# Patient Record
Sex: Female | Born: 1955
Health system: Southern US, Community
[De-identification: ages and names within clinical notes are randomized; demographics above are authoritative.]

## PROBLEM LIST (undated history)

## (undated) DIAGNOSIS — I10 Essential (primary) hypertension: Secondary | ICD-10-CM

## (undated) DIAGNOSIS — E119 Type 2 diabetes mellitus without complications: Secondary | ICD-10-CM

## (undated) HISTORY — PX: CARDIAC SURGERY: SHX584

---

## 2010-12-14 ENCOUNTER — Emergency Department (HOSPITAL_BASED_OUTPATIENT_CLINIC_OR_DEPARTMENT_OTHER)
Admission: EM | Admit: 2010-12-14 | Discharge: 2010-12-14 | Disposition: A | Discharge status: Home / Self Care | Payer: PRIVATE HEALTH INSURANCE | Attending: Emergency Medicine | Admitting: Emergency Medicine

## 2010-12-14 ENCOUNTER — Encounter: Payer: Self-pay | Admitting: *Deleted

## 2010-12-14 ENCOUNTER — Emergency Department (INDEPENDENT_AMBULATORY_CARE_PROVIDER_SITE_OTHER): Payer: PRIVATE HEALTH INSURANCE

## 2010-12-14 DIAGNOSIS — R059 Cough, unspecified: Secondary | ICD-10-CM | POA: Insufficient documentation

## 2010-12-14 DIAGNOSIS — B9789 Other viral agents as the cause of diseases classified elsewhere: Secondary | ICD-10-CM | POA: Insufficient documentation

## 2010-12-14 DIAGNOSIS — J449 Chronic obstructive pulmonary disease, unspecified: Secondary | ICD-10-CM

## 2010-12-14 DIAGNOSIS — F17200 Nicotine dependence, unspecified, uncomplicated: Secondary | ICD-10-CM

## 2010-12-14 DIAGNOSIS — R05 Cough: Secondary | ICD-10-CM

## 2010-12-14 DIAGNOSIS — IMO0001 Reserved for inherently not codable concepts without codable children: Secondary | ICD-10-CM | POA: Insufficient documentation

## 2010-12-14 DIAGNOSIS — J3489 Other specified disorders of nose and nasal sinuses: Secondary | ICD-10-CM | POA: Insufficient documentation

## 2010-12-14 DIAGNOSIS — B349 Viral infection, unspecified: Secondary | ICD-10-CM

## 2010-12-14 HISTORY — DX: Essential (primary) hypertension: I10

## 2010-12-14 MED ORDER — HYDROCODONE-ACETAMINOPHEN 5-500 MG PO TABS: 1.0000 | ORAL_TABLET | Freq: Four times a day (QID) | ORAL | Status: AC | PRN

## 2010-12-14 NOTE — ED Provider Notes (Signed)
History     CSN: [id number] Arrival date & time: 12/14/2010 10:12 AM  Chief Complaint  Patient presents with  . Nasal Congestion  . Cough  . Generalized Body Aches    HPI  (Consider location/radiation/quality/duration/timing/severity/associated sxs/prior treatment)  Patient is a 55 y.o. female presenting with cough. The history is provided by the patient.  Cough The current episode started more than 1 week ago. The problem has been gradually worsening. The cough is productive of sputum. There has been no fever. Associated symptoms include rhinorrhea. Pertinent negatives include no chest pain, no sweats, no weight loss, no headaches and no shortness of breath. She has tried decongestants for the symptoms. The treatment provided no relief. She is a smoker. Her past medical history does not include pneumonia, bronchiectasis or emphysema.  cough and rhinorrhea for the last week. Now more pain in her shoulders. She has fatigue and feels bad. No fevers. Her ears feel full.   Past Medical History  Diagnosis Date  . Hypertension     History reviewed. No pertinent past surgical history.  History reviewed. No pertinent family history.  History  Substance Use Topics  . Smoking status: Never Smoker   . Smokeless tobacco: Never Used  . Alcohol Use: No    OB History    Grav Para Term Preterm Abortions TAB SAB Ect Mult Living                  Review of Systems  Review of Systems  Constitutional: Negative for weight loss, activity change and appetite change.  HENT: Positive for congestion and rhinorrhea. Negative for neck stiffness.   Eyes: Negative for pain.  Respiratory: Positive for cough and chest tightness. Negative for shortness of breath.   Cardiovascular: Negative for chest pain and leg swelling.  Gastrointestinal: Negative for nausea, vomiting, abdominal pain and diarrhea.  Genitourinary: Negative for flank pain.  Musculoskeletal: Negative for back pain.  Skin: Negative  for rash.  Neurological: Negative for weakness, numbness and headaches.  Psychiatric/Behavioral: Negative for behavioral problems.    Allergies  Review of patient's allergies indicates no known allergies.  Home Medications   Current Outpatient Rx  Name Route Sig Dispense Refill  . ATENOLOL 25 MG PO TABS Oral Take 25 mg by mouth daily.        Physical Exam    BP 137/85  Pulse 100  Temp(Src) 98.1 F (36.7 C) (Oral)  Resp 22  SpO2 100%  Physical Exam  Nursing note and vitals reviewed. Constitutional: She is oriented to person, place, and time. She appears well-developed and well-nourished.  HENT:  Head: Normocephalic and atraumatic.       Mild posterior pahryngeal erethema. Without exudate.   Eyes: EOM are normal. Pupils are equal, round, and reactive to light.  Neck: Normal range of motion. Neck supple.  Cardiovascular: Normal rate, regular rhythm and normal heart sounds.   No murmur heard. Pulmonary/Chest: Effort normal. No respiratory distress. She has no wheezes. She has no rales.       Mildly harsh breath sounds.   Abdominal: Soft. Bowel sounds are normal. She exhibits no distension. There is no tenderness. There is no rebound and no guarding.  Musculoskeletal: Normal range of motion.       Tender over trapezius.  Neurological: She is alert and oriented to person, place, and time. No cranial nerve deficit.  Skin: Skin is warm and dry.  Psychiatric: She has a normal mood and affect. Her speech is normal.  ED Course  Procedures (including critical care time)  Labs Reviewed - No data to display No results found.   No diagnosis found.   MDM Cough and URI symptoms. Myalgias. Xray no pneumonia. Will d/c.         Juliet Rude. Rubin Payor, MD 12/16/10 1443

## 2010-12-14 NOTE — ED Notes (Signed)
Pt states she has been congested" for a while" but started getting feeling worse on Friday  States has "stopped up ears" and cough reports sputum is usually clear but a couple of times has blown her nose and coughed up yellowish sputum. States she has been having some body aches and discomfort in neck and upper back just generally feeling "tired and washed out"

## 2014-01-09 DIAGNOSIS — I10 Essential (primary) hypertension: Secondary | ICD-10-CM | POA: Insufficient documentation

## 2014-05-16 DIAGNOSIS — E1165 Type 2 diabetes mellitus with hyperglycemia: Secondary | ICD-10-CM | POA: Insufficient documentation

## 2014-05-16 DIAGNOSIS — E559 Vitamin D deficiency, unspecified: Secondary | ICD-10-CM | POA: Insufficient documentation

## 2014-05-16 DIAGNOSIS — E7800 Pure hypercholesterolemia, unspecified: Secondary | ICD-10-CM | POA: Insufficient documentation

## 2018-10-31 ENCOUNTER — Other Ambulatory Visit: Payer: Self-pay

## 2018-10-31 ENCOUNTER — Emergency Department (HOSPITAL_BASED_OUTPATIENT_CLINIC_OR_DEPARTMENT_OTHER): Payer: Commercial Managed Care - PPO

## 2018-10-31 ENCOUNTER — Emergency Department (HOSPITAL_BASED_OUTPATIENT_CLINIC_OR_DEPARTMENT_OTHER)
Admission: EM | Admit: 2018-10-31 | Discharge: 2018-10-31 | Disposition: A | Discharge status: Home / Self Care | Payer: Commercial Managed Care - PPO | Attending: Emergency Medicine | Admitting: Emergency Medicine

## 2018-10-31 ENCOUNTER — Encounter (HOSPITAL_BASED_OUTPATIENT_CLINIC_OR_DEPARTMENT_OTHER): Payer: Self-pay | Admitting: *Deleted

## 2018-10-31 DIAGNOSIS — M5442 Lumbago with sciatica, left side: Secondary | ICD-10-CM | POA: Insufficient documentation

## 2018-10-31 DIAGNOSIS — I10 Essential (primary) hypertension: Secondary | ICD-10-CM | POA: Diagnosis not present

## 2018-10-31 DIAGNOSIS — F17200 Nicotine dependence, unspecified, uncomplicated: Secondary | ICD-10-CM | POA: Diagnosis not present

## 2018-10-31 DIAGNOSIS — Z79899 Other long term (current) drug therapy: Secondary | ICD-10-CM | POA: Insufficient documentation

## 2018-10-31 DIAGNOSIS — M545 Low back pain: Secondary | ICD-10-CM | POA: Diagnosis present

## 2018-10-31 MED ORDER — HYDROCODONE-ACETAMINOPHEN 5-325 MG PO TABS: 1.0000 | ORAL_TABLET | Freq: Four times a day (QID) | ORAL | 0 refills | Status: AC | PRN

## 2018-10-31 MED ORDER — NAPROXEN 375 MG PO TABS: 375.0000 mg | ORAL_TABLET | Freq: Two times a day (BID) | ORAL | 0 refills | Status: AC

## 2018-10-31 MED ORDER — HYDROCODONE-ACETAMINOPHEN 5-325 MG PO TABS
2.0000 | ORAL_TABLET | Freq: Once | ORAL | Status: AC
  Administered 2018-10-31: 2 via ORAL
  Filled 2018-10-31: qty 2

## 2018-10-31 MED FILL — NAPROXEN 375 MG TABLET: 375 | 7 days supply | Qty: 15 | Fill #0

## 2018-10-31 MED FILL — HYDROCODON-APAP 5-325: 5-325 | 3 days supply | Qty: 15 | Fill #0

## 2018-10-31 NOTE — ED Notes (Signed)
Pt returned from xray, pt tearful c/o pain. Hot pack applied

## 2018-10-31 NOTE — ED Notes (Signed)
ED Provider at bedside. 

## 2018-10-31 NOTE — ED Notes (Signed)
Patient transported to X-ray 

## 2018-10-31 NOTE — ED Notes (Signed)
Pt verbalizes pain when sitting, or lying. Denis bladder or bowel issues.

## 2018-10-31 NOTE — ED Provider Notes (Signed)
[facility] EMERGENCY DEPARTMENT Provider Note   CSN: [id number] Arrival date & time: 10/31/18  1128     History   Chief Complaint Chief Complaint  Patient presents with  . Back Pain    HPI Beverly Sullivan is a 63 y.o. female.     Patient is a 63 year old female with history of hypertension.  She presents today with complaints of low back pain.  This started several days ago when she bent over to pick up an object.  She describes discomfort in her low back that radiates to her left buttock.  She denies any weakness or numbness in her legs.  She denies any bowel or bladder complaints.  Pain is worse with bending, movement, and ambulation.  There are no alleviating factors.  The history is provided by the patient.  Back Pain Location:  Lumbar spine Quality:  Stabbing Radiates to: Left buttock. Pain severity:  Severe Pain is:  Same all the time Onset quality:  Sudden Duration:  3 days Timing:  Constant Progression:  Worsening Relieved by:  Nothing Worsened by:  Palpation, ambulation and bending   Past Medical History:  Diagnosis Date  . Hypertension     There are no active problems to display for this patient.   History reviewed. No pertinent surgical history.   OB History   No obstetric history on file.      Home Medications    Prior to Admission medications   Medication Sig Start Date End Date Taking? Authorizing Provider  atenolol (TENORMIN) 25 MG tablet Take 25 mg by mouth daily.     Yes [provider]    Family History No family history on file.  Social History Social History   Tobacco Use  . Smoking status: Current Every Day Smoker  . Smokeless tobacco: Never Used  Substance Use Topics  . Alcohol use: Yes  . Drug use: No     Allergies   Patient has no known allergies.   Review of Systems Review of Systems  Musculoskeletal: Positive for back pain.  All other systems reviewed and are negative.    Physical Exam  Updated Vital Signs BP (!) 184/94   Pulse (!) 104   Temp 98.4 F (36.9 C) (Oral)   Resp 18   Ht 5\' 1"  (1.549 m)   Wt 67.1 kg   SpO2 98%   BMI 27.96 kg/m   Physical Exam Vitals signs and nursing note reviewed.  Constitutional:      General: She is not in acute distress.    Appearance: Normal appearance. She is not ill-appearing.  HENT:     Head: Normocephalic and atraumatic.  Pulmonary:     Effort: Pulmonary effort is normal.  Musculoskeletal:     Comments: There is tenderness to palpation in the soft tissues of the lower lumbar region and upper left buttock.  Skin:    General: Skin is warm and dry.  Neurological:     Mental Status: She is alert and oriented to person, place, and time.     Comments: DTRs are trace and symmetrical in both lower extremities.  Strength is 5 out of 5 in both lower extremities.  Patient is able to ambulate and bear weight, however with an antalgic gait.      ED Treatments / Results  Labs (all labs ordered are listed, but only abnormal results are displayed) Labs Reviewed - No data to display  EKG None  Radiology No results found.  Procedures Procedures (  including critical care time)  Medications Ordered in ED Medications  HYDROcodone-acetaminophen (NORCO/VICODIN) 5-325 MG per tablet 2 tablet (has no administration in time range)     Initial Impression / Assessment and Plan / ED Course  I have reviewed the triage vital signs and the nursing notes.  Pertinent labs & imaging results that were available during my care of the patient were reviewed by me and considered in my medical decision making (see chart for details).  Patient presents with complaints of back pain that began in the absence of any injury or trauma.  Patient's strength and reflexes are symmetrical and there are no bowel or bladder complaints.  There are no red flags that would suggest an emergent situation.  She is feeling better after medication given in the ER.   Patient will be discharged with anti-inflammatories, pain medicine, and follow-up as needed.  Final Clinical Impressions(s) / ED Diagnoses   Final diagnoses:  None    ED Discharge Orders    None       Veryl Speak, MD 10/31/18 1326

## 2018-10-31 NOTE — ED Triage Notes (Signed)
Lower back pain. Pain started after bending over 2 days ago.

## 2018-10-31 NOTE — Discharge Instructions (Signed)
Naproxen as prescribed.  Hydrocodone as prescribed as needed for pain not relieved with naproxen.  Follow-up with your primary doctor if your symptoms or not improving in the next few days.

## 2019-12-03 ENCOUNTER — Other Ambulatory Visit: Payer: Self-pay

## 2019-12-03 ENCOUNTER — Emergency Department (HOSPITAL_BASED_OUTPATIENT_CLINIC_OR_DEPARTMENT_OTHER): Payer: Commercial Managed Care - PPO

## 2019-12-03 ENCOUNTER — Encounter (HOSPITAL_BASED_OUTPATIENT_CLINIC_OR_DEPARTMENT_OTHER): Payer: Self-pay

## 2019-12-03 ENCOUNTER — Emergency Department (HOSPITAL_BASED_OUTPATIENT_CLINIC_OR_DEPARTMENT_OTHER)
Admission: EM | Admit: 2019-12-03 | Discharge: 2019-12-03 | Disposition: A | Discharge status: Home / Self Care | Payer: Commercial Managed Care - PPO | Attending: Emergency Medicine | Admitting: Emergency Medicine

## 2019-12-03 ENCOUNTER — Encounter: Payer: Self-pay | Admitting: Emergency Medicine

## 2019-12-03 DIAGNOSIS — R059 Cough, unspecified: Secondary | ICD-10-CM

## 2019-12-03 DIAGNOSIS — I10 Essential (primary) hypertension: Secondary | ICD-10-CM | POA: Diagnosis not present

## 2019-12-03 DIAGNOSIS — J069 Acute upper respiratory infection, unspecified: Secondary | ICD-10-CM | POA: Insufficient documentation

## 2019-12-03 DIAGNOSIS — Z7984 Long term (current) use of oral hypoglycemic drugs: Secondary | ICD-10-CM | POA: Insufficient documentation

## 2019-12-03 DIAGNOSIS — Z20822 Contact with and (suspected) exposure to covid-19: Secondary | ICD-10-CM | POA: Insufficient documentation

## 2019-12-03 DIAGNOSIS — E119 Type 2 diabetes mellitus without complications: Secondary | ICD-10-CM | POA: Diagnosis not present

## 2019-12-03 DIAGNOSIS — R000 Tachycardia, unspecified: Secondary | ICD-10-CM | POA: Insufficient documentation

## 2019-12-03 DIAGNOSIS — R05 Cough: Secondary | ICD-10-CM | POA: Diagnosis present

## 2019-12-03 DIAGNOSIS — F17210 Nicotine dependence, cigarettes, uncomplicated: Secondary | ICD-10-CM | POA: Insufficient documentation

## 2019-12-03 HISTORY — DX: Type 2 diabetes mellitus without complications: E11.9

## 2019-12-03 LAB — CBG MONITORING, ED: Glucose-Capillary: 261 mg/dL — ABNORMAL HIGH (ref 70–99)

## 2019-12-03 LAB — SARS CORONAVIRUS 2 BY RT PCR (HOSPITAL ORDER, PERFORMED IN ~~LOC~~ HOSPITAL LAB): SARS Coronavirus 2: NEGATIVE

## 2019-12-03 MED ORDER — SODIUM CHLORIDE 0.9 % IV BOLUS: 1000.0000 mL | Freq: Once | INTRAVENOUS | Status: DC

## 2019-12-03 NOTE — ED Provider Notes (Signed)
[facility] EMERGENCY DEPARTMENT Provider Note  CSN: [id number] Arrival date & time: 12/03/19 4098    History Chief Complaint  Patient presents with  . Cough  . Shortness of Breath    HPI  Lanika Colgate is a 64 y.o. female with history of HTN and DM reports her adult son was recently diagnosed with URI at [facility] (Covid was negative). She began to have cough, SOB and loss of taste/smell 2 days ago. She has not had a fever. She is fully vaccinated. Her husband has had similar symptoms.    Past Medical History:  Diagnosis Date  . Diabetes mellitus without complication (HCC)   . Hypertension     Past Surgical History:  Procedure Laterality Date  . CARDIAC SURGERY      No family history on file.  Social History   Tobacco Use  . Smoking status: Current Every Day Smoker    Packs/day: 0.50    Types: Cigarettes  . Smokeless tobacco: Never Used  Substance Use Topics  . Alcohol use: Yes    Comment: wine  . Drug use: Never     Home Medications Prior to Admission medications   Medication Sig Start Date End Date Taking? Authorizing Provider  glipiZIDE (GLUCOTROL XL) 5 MG 24 hr tablet Take 5 mg by mouth daily. 07/24/19   [provider]  metFORMIN (GLUCOPHAGE-XR) 500 MG 24 hr tablet Take 1,000 mg by mouth 2 (two) times daily. 10/23/19   [provider]     Allergies    Patient has no known allergies.   Review of Systems   Review of Systems A comprehensive review of systems was completed and negative except as noted in HPI.    Physical Exam BP (!) 148/92 (BP Location: Left Arm)   Pulse (!) 115   Temp 99.3 F (37.4 C) (Oral)   Resp (!) 22   Ht 5\' 1"  (1.549 m)   Wt 65.9 kg   SpO2 98%   BMI 27.44 kg/m   Physical Exam Vitals and nursing note reviewed.  Constitutional:      Appearance: Normal appearance.  HENT:     Head: Normocephalic and atraumatic.     Nose: Nose normal.     Mouth/Throat:     Mouth: Mucous membranes are moist.    Eyes:     Extraocular Movements: Extraocular movements intact.     Conjunctiva/sclera: Conjunctivae normal.  Cardiovascular:     Rate and Rhythm: Tachycardia present.  Pulmonary:     Effort: Pulmonary effort is normal.     Breath sounds: Rhonchi present.  Abdominal:     General: Abdomen is flat.     Palpations: Abdomen is soft.     Tenderness: There is no abdominal tenderness.  Musculoskeletal:        General: No swelling. Normal range of motion.     Cervical back: Neck supple.  Skin:    General: Skin is warm and dry.  Neurological:     General: No focal deficit present.     Mental Status: She is alert.  Psychiatric:        Mood and Affect: Mood normal.      ED Results / Procedures / Treatments   Labs (all labs ordered are listed, but only abnormal results are displayed) Labs Reviewed  CBG MONITORING, ED - Abnormal; Notable for the following components:      Result Value   Glucose-Capillary 261 (*)    All other components within normal limits  SARS CORONAVIRUS 2 BY RT PCR [facility] ORDER, PERFORMED IN [facility])    EKG EKG Interpretation  Date/Time:  Monday December 03 2019 09:28:42 EDT Ventricular Rate:  136 PR Interval:    QRS Duration: 70 QT Interval:  282 QTC Calculation: 425 R Axis:   90 Text Interpretation: Sinus tachycardia Consider right atrial enlargement Borderline right axis deviation Borderline T wave abnormalities No old tracing to compare Confirmed by Susy Frizzle [phone]) on 12/03/2019 9:54:09 AM    Radiology DG Chest Port 1 View  Result Date: 12/03/2019 CLINICAL DATA:  Shortness of breath. EXAM: PORTABLE CHEST 1 VIEW COMPARISON:  05/30/2014 chest radiograph and prior FINDINGS: Hazy and patchy bilateral perihilar and bibasilar opacities. No pneumothorax or pleural effusion. Cardiomediastinal silhouette within normal limits. No acute osseous abnormality. IMPRESSION: Perihilar and bibasilar opacities, atelectasis versus infection.  Electronically Signed   By: Stana Bunting M.D.   On: 12/03/2019 10:59    Procedures Procedures  Medications Ordered in the ED Medications  sodium chloride 0.9 % bolus 1,000 mL (has no administration in time range)     MDM Rules/Calculators/A&P MDM Patient's symptoms concerning for Covid. Will check Covid swab first, if positive may consider MAB treatment as she is not hypoxic here.  ED Course  I have reviewed the triage vital signs and the nursing notes.  Pertinent labs & imaging results that were available during my care of the patient were reviewed by me and considered in my medical decision making (see chart for details).  Clinical Course as of Dec 02 1113  Mon Dec 03, 2019  1036 Covid is neg. Given persistent tachycardia, will check basic labs, CXR and give IVF.    [CS]  1113 Spoke with patient regarding labs and IVF and she would like to hold off on doing that now. Her CXR with likely atelectasis. I have a low suspicion for bacterial pneumonia. Recommend supportive care for viral URI and PCP followup for recheck. RTED for any other concerns.   [CS]    Clinical Course User Index [CS] Pollyann Savoy, MD    Final Clinical Impression(s) / ED Diagnoses Final diagnoses:  Viral URI with cough    Rx / DC Orders ED Discharge Orders    None       Pollyann Savoy, MD 12/03/19 1116

## 2019-12-03 NOTE — ED Notes (Signed)
ED Provider at bedside. 

## 2019-12-03 NOTE — ED Triage Notes (Signed)
Pt arrives with c/o cough, SOB< and loss of taste starting Saturday. Pt. Is fully vaccinated, husband also sick, denies being around anyone who is positive that she knows of.

## 2019-12-17 ENCOUNTER — Encounter (HOSPITAL_BASED_OUTPATIENT_CLINIC_OR_DEPARTMENT_OTHER): Payer: Self-pay | Admitting: *Deleted

## 2020-11-24 IMAGING — DX DG CHEST 1V PORT
1 series · 1 of 1 positions shown · non-contrast
Comparison: 05/30/2014 chest radiograph and prior

CLINICAL DATA: Shortness of breath.

EXAM:
PORTABLE CHEST 1 VIEW

[chest ap]
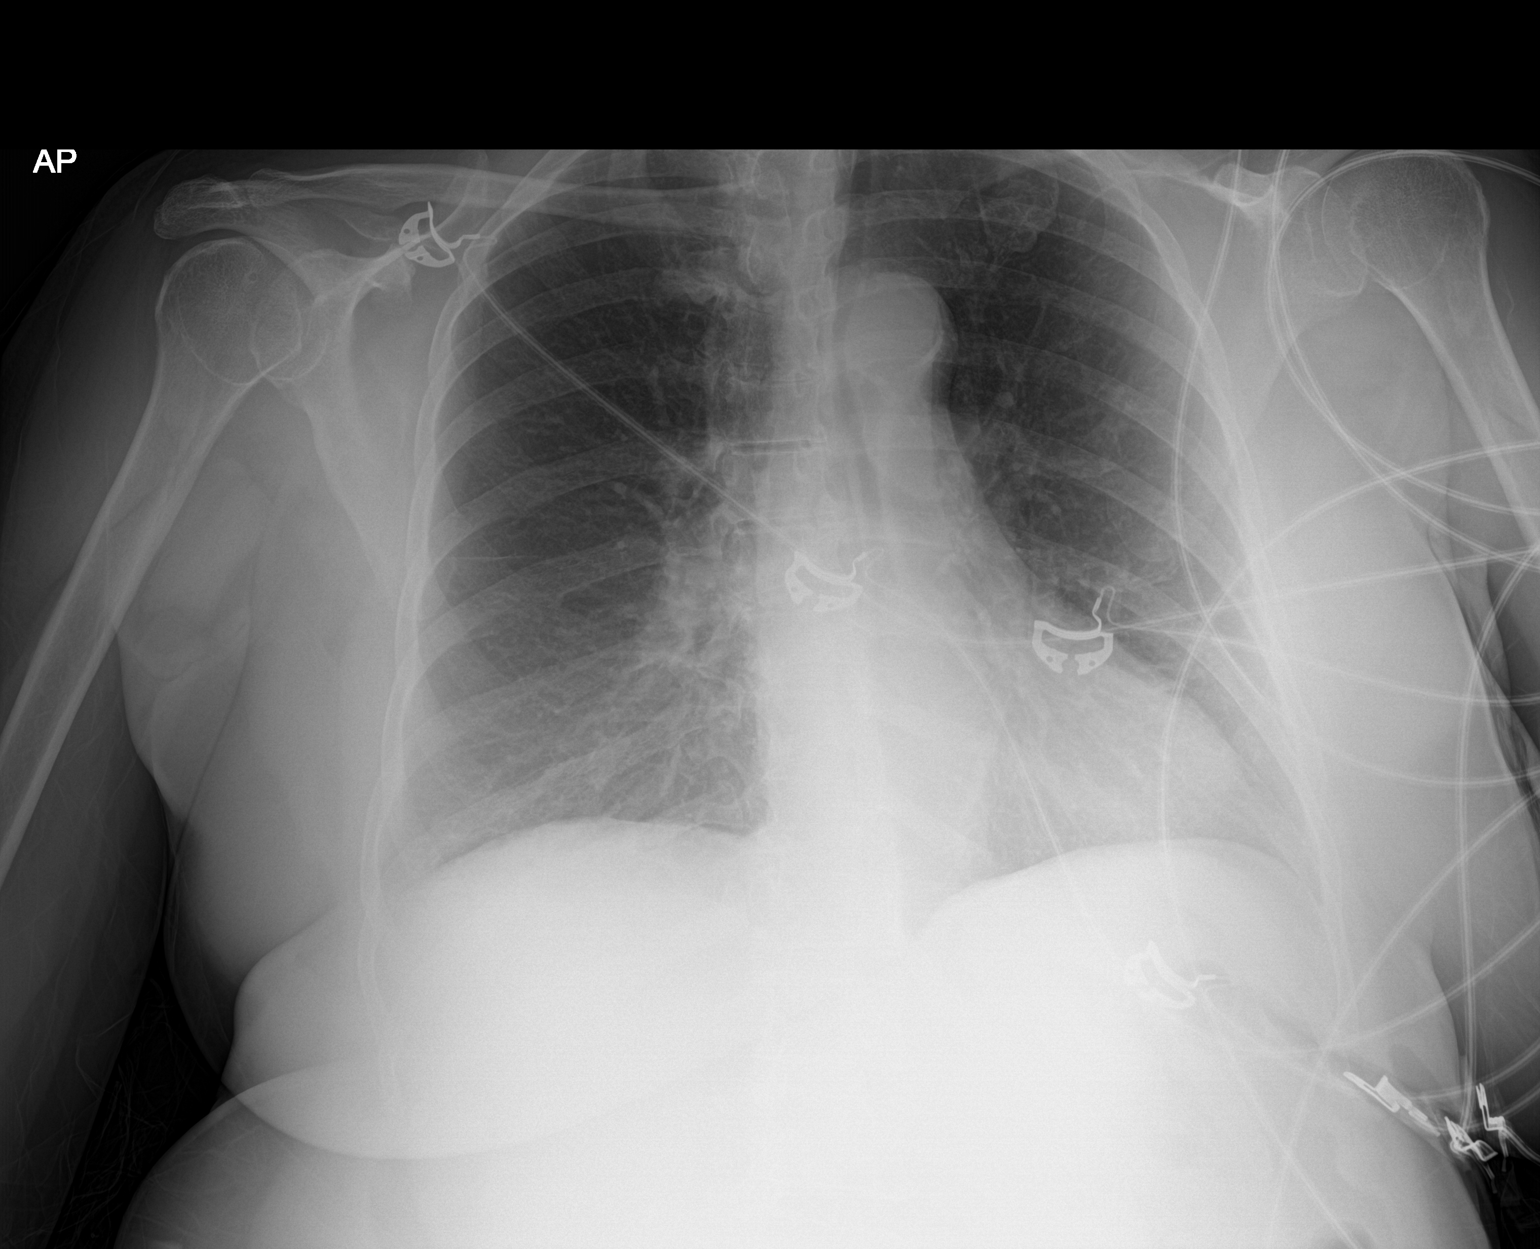

[1 of 1 positions shown; findings below may reference images not displayed]

FINDINGS: Hazy and patchy bilateral perihilar and bibasilar opacities. No
pneumothorax or pleural effusion. Cardiomediastinal silhouette
within normal limits. No acute osseous abnormality.
IMPRESSION: Perihilar and bibasilar opacities, atelectasis versus infection.
